# Patient Record
Sex: Male | Born: 1995
Health system: Southern US, Community
[De-identification: ages and names within clinical notes are randomized; demographics above are authoritative.]

## PROBLEM LIST (undated history)

## (undated) ENCOUNTER — Emergency Department (HOSPITAL_COMMUNITY): Discharge status: Against Medical Advice | Payer: 59 | Attending: Emergency Medicine | Admitting: Emergency Medicine

## (undated) DIAGNOSIS — S62101A Fracture of unspecified carpal bone, right wrist, initial encounter for closed fracture: Secondary | ICD-10-CM

## (undated) DIAGNOSIS — K589 Irritable bowel syndrome without diarrhea: Secondary | ICD-10-CM

## (undated) DIAGNOSIS — S329XXA Fracture of unspecified parts of lumbosacral spine and pelvis, initial encounter for closed fracture: Secondary | ICD-10-CM

## (undated) DIAGNOSIS — T148XXA Other injury of unspecified body region, initial encounter: Secondary | ICD-10-CM

## (undated) HISTORY — PX: APPENDECTOMY: SHX54

## (undated) HISTORY — DX: Fracture of unspecified parts of lumbosacral spine and pelvis, initial encounter for closed fracture: S32.9XXA

## (undated) HISTORY — DX: Fracture of unspecified carpal bone, right wrist, initial encounter for closed fracture: S62.101A

## (undated) HISTORY — PX: FINGER SURGERY: SHX640

---
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
## 2016-01-18 ENCOUNTER — Encounter: Payer: Self-pay | Admitting: Internal Medicine

## 2016-01-18 ENCOUNTER — Ambulatory Visit (INDEPENDENT_AMBULATORY_CARE_PROVIDER_SITE_OTHER): Payer: 59 | Admitting: Internal Medicine

## 2016-01-18 VITALS — BP 110/70 | HR 83 | Temp 98.1°F | Resp 18 | Ht 70.5 in | Wt 159.2 lb

## 2016-01-18 DIAGNOSIS — Z91048 Other nonmedicinal substance allergy status: Secondary | ICD-10-CM

## 2016-01-18 DIAGNOSIS — Z9109 Other allergy status, other than to drugs and biological substances: Secondary | ICD-10-CM

## 2016-01-18 DIAGNOSIS — Z0000 Encounter for general adult medical examination without abnormal findings: Secondary | ICD-10-CM | POA: Diagnosis not present

## 2016-01-18 DIAGNOSIS — R351 Nocturia: Secondary | ICD-10-CM
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
## 2016-01-18 NOTE — Progress Notes (Signed)
Pre-visit discussion using our clinic review tool. No additional management support is needed unless otherwise documented below in the visit note.  

## 2016-01-25 ENCOUNTER — Encounter: Payer: Self-pay | Admitting: Internal Medicine

## 2016-01-25 DIAGNOSIS — Z9109 Other allergy status, other than to drugs and biological substances: Secondary | ICD-10-CM | POA: Insufficient documentation

## 2016-01-25 DIAGNOSIS — R351 Nocturia: Secondary | ICD-10-CM | POA: Insufficient documentation

## 2016-01-25 DIAGNOSIS — Z0000 Encounter for general adult medical examination without abnormal findings: Secondary | ICD-10-CM | POA: Insufficient documentation

## 2016-01-25 NOTE — Assessment & Plan Note (Signed)
On zyrtec.

## 2016-01-25 NOTE — Assessment & Plan Note (Signed)
Drinks a lot of water.  Drinks later in the day.  Will shift fluid intake.  Notify me if persistent problems.

## 2016-01-25 NOTE — Assessment & Plan Note (Signed)
Up to date with immunizations.  Obtain records from [facility].

## 2017-01-19 ENCOUNTER — Encounter: Payer: 59 | Admitting: Internal Medicine

## 2017-02-06 ENCOUNTER — Encounter: Payer: 59 | Admitting: Internal Medicine

## 2019-05-20 ENCOUNTER — Other Ambulatory Visit: Payer: Self-pay

## 2019-05-21 ENCOUNTER — Other Ambulatory Visit: Payer: Self-pay

## 2019-05-21 ENCOUNTER — Ambulatory Visit (INDEPENDENT_AMBULATORY_CARE_PROVIDER_SITE_OTHER): Payer: 59 | Admitting: Internal Medicine

## 2019-05-21 VITALS — BP 120/78 | HR 73 | Temp 98.0°F | Resp 16 | Ht 70.0 in | Wt 156.0 lb

## 2019-05-21 DIAGNOSIS — Z9109 Other allergy status, other than to drugs and biological substances: Secondary | ICD-10-CM | POA: Diagnosis not present

## 2019-05-21 DIAGNOSIS — Z0000 Encounter for general adult medical examination without abnormal findings: Secondary | ICD-10-CM
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
## 2019-05-22 ENCOUNTER — Ambulatory Visit: Payer: 59 | Admitting: Internal Medicine

## 2019-05-26 ENCOUNTER — Encounter: Payer: Self-pay | Admitting: Internal Medicine

## 2019-05-26 NOTE — Assessment & Plan Note (Signed)
No allergy problem reported.

## 2019-05-26 NOTE — Assessment & Plan Note (Signed)
Physical today.   

## 2019-05-31 ENCOUNTER — Ambulatory Visit: Payer: 59 | Admitting: Internal Medicine

## 2019-07-17 DIAGNOSIS — U071 COVID-19: Secondary | ICD-10-CM | POA: Diagnosis not present

## 2019-07-17 DIAGNOSIS — Z20828 Contact with and (suspected) exposure to other viral communicable diseases: Secondary | ICD-10-CM | POA: Diagnosis not present

## 2019-07-30 ENCOUNTER — Ambulatory Visit: Payer: 59 | Admitting: Internal Medicine

## 2019-08-16 DIAGNOSIS — H5203 Hypermetropia, bilateral: Secondary | ICD-10-CM | POA: Diagnosis not present

## 2020-05-21 ENCOUNTER — Encounter: Payer: 59 | Admitting: Internal Medicine

## 2020-05-21 DIAGNOSIS — Z0289 Encounter for other administrative examinations: Secondary | ICD-10-CM

## 2020-09-22 ENCOUNTER — Ambulatory Visit
Admission: EM | Admit: 2020-09-22 | Discharge: 2020-09-22 | Disposition: A | Discharge status: Home / Self Care | Payer: 59 | Attending: Family Medicine | Admitting: Family Medicine

## 2020-09-22 ENCOUNTER — Encounter: Payer: Self-pay | Admitting: Emergency Medicine

## 2020-09-22 ENCOUNTER — Other Ambulatory Visit: Payer: Self-pay

## 2020-09-22 DIAGNOSIS — J029 Acute pharyngitis, unspecified: Secondary | ICD-10-CM | POA: Diagnosis not present

## 2020-09-22 LAB — POCT RAPID STREP A (OFFICE): Rapid Strep A Screen: NEGATIVE
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
## 2020-09-22 NOTE — ED Triage Notes (Addendum)
Patient c/o sore throat since this Saturday.    Patient endorses painful swallowing.   Patient denies fever, cough, or any other cold like symptoms.   Patient has used OTC cough/cold and Ibuprofen medication w/ no relief of symptoms.

## 2020-09-22 NOTE — Discharge Instructions (Addendum)
Rapid strep test negative.  Sending for culture.  You  can do warm salt water gargles of throat.  Over-the-counter medicines for symptoms as needed Follow up as needed for continued or worsening symptoms

## 2020-09-25 LAB — CULTURE, GROUP A STREP (THRC)

## 2021-03-04 ENCOUNTER — Other Ambulatory Visit: Payer: Self-pay

## 2021-03-04 ENCOUNTER — Ambulatory Visit
Admission: EM | Admit: 2021-03-04 | Discharge: 2021-03-04 | Disposition: A | Discharge status: Home / Self Care | Payer: 59 | Attending: Emergency Medicine | Admitting: Emergency Medicine

## 2021-03-04 DIAGNOSIS — Z1152 Encounter for screening for COVID-19: Secondary | ICD-10-CM | POA: Diagnosis not present

## 2021-03-04 DIAGNOSIS — J069 Acute upper respiratory infection, unspecified: Secondary | ICD-10-CM

## 2021-03-04 NOTE — Discharge Instructions (Signed)
Take ibuprofen and Mucinex as directed.    Your COVID test is pending.  You should self quarantine until the test result is back.    Follow up with your primary care provider if your symptoms are not improving.     

## 2021-03-04 NOTE — ED Triage Notes (Signed)
Pt reports having nasal congestion, headache and sinus pressure since Sunday. Denies having fever.
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
## 2021-03-05 LAB — SARS-COV-2, NAA 2 DAY TAT

## 2021-03-05 LAB — NOVEL CORONAVIRUS, NAA: SARS-CoV-2, NAA: NOT DETECTED

## 2021-03-24 ENCOUNTER — Other Ambulatory Visit (HOSPITAL_COMMUNITY): Payer: Self-pay

## 2021-03-24 DIAGNOSIS — K5900 Constipation, unspecified: Secondary | ICD-10-CM | POA: Diagnosis not present

## 2021-03-24 DIAGNOSIS — R1013 Epigastric pain: Secondary | ICD-10-CM | POA: Diagnosis not present

## 2021-03-24 MED ORDER — PANTOPRAZOLE SODIUM 40 MG PO TBEC
DELAYED_RELEASE_TABLET | ORAL | 2 refills | Status: DC
  Filled 2021-03-24: qty 30, 30d supply, fill #0

## 2021-10-10 ENCOUNTER — Emergency Department (HOSPITAL_COMMUNITY): Payer: 59

## 2021-10-10 ENCOUNTER — Encounter (HOSPITAL_COMMUNITY): Payer: Self-pay | Admitting: Emergency Medicine

## 2021-10-10 ENCOUNTER — Emergency Department (HOSPITAL_COMMUNITY)
Admission: EM | Admit: 2021-10-10 | Discharge: 2021-10-10 | Disposition: A | Discharge status: Home / Self Care | Payer: 59 | Attending: Emergency Medicine | Admitting: Emergency Medicine

## 2021-10-10 ENCOUNTER — Other Ambulatory Visit: Payer: Self-pay

## 2021-10-10 DIAGNOSIS — S42115A Nondisplaced fracture of body of scapula, left shoulder, initial encounter for closed fracture: Secondary | ICD-10-CM | POA: Diagnosis not present

## 2021-10-10 DIAGNOSIS — S199XXA Unspecified injury of neck, initial encounter: Secondary | ICD-10-CM | POA: Diagnosis not present

## 2021-10-10 DIAGNOSIS — R55 Syncope and collapse: Secondary | ICD-10-CM | POA: Diagnosis not present

## 2021-10-10 DIAGNOSIS — Z79899 Other long term (current) drug therapy: Secondary | ICD-10-CM | POA: Diagnosis not present

## 2021-10-10 DIAGNOSIS — J329 Chronic sinusitis, unspecified: Secondary | ICD-10-CM | POA: Diagnosis not present

## 2021-10-10 DIAGNOSIS — S2232XA Fracture of one rib, left side, initial encounter for closed fracture: Secondary | ICD-10-CM | POA: Insufficient documentation

## 2021-10-10 DIAGNOSIS — S3993XA Unspecified injury of pelvis, initial encounter: Secondary | ICD-10-CM | POA: Diagnosis not present

## 2021-10-10 DIAGNOSIS — S42035A Nondisplaced fracture of lateral end of left clavicle, initial encounter for closed fracture: Secondary | ICD-10-CM | POA: Diagnosis not present

## 2021-10-10 DIAGNOSIS — R911 Solitary pulmonary nodule: Secondary | ICD-10-CM | POA: Diagnosis not present

## 2021-10-10 DIAGNOSIS — Z9049 Acquired absence of other specified parts of digestive tract: Secondary | ICD-10-CM | POA: Diagnosis not present

## 2021-10-10 DIAGNOSIS — S2242XA Multiple fractures of ribs, left side, initial encounter for closed fracture: Secondary | ICD-10-CM | POA: Diagnosis not present

## 2021-10-10 DIAGNOSIS — S3991XA Unspecified injury of abdomen, initial encounter: Secondary | ICD-10-CM | POA: Diagnosis not present

## 2021-10-10 DIAGNOSIS — S40912A Unspecified superficial injury of left shoulder, initial encounter: Secondary | ICD-10-CM | POA: Diagnosis present

## 2021-10-10 DIAGNOSIS — S0993XA Unspecified injury of face, initial encounter: Secondary | ICD-10-CM | POA: Diagnosis not present

## 2021-10-10 LAB — BASIC METABOLIC PANEL
Anion gap: 7 (ref 5–15)
BUN: 21 mg/dL — ABNORMAL HIGH (ref 6–20)
CO2: 29 mmol/L (ref 22–32)
Calcium: 9.2 mg/dL (ref 8.9–10.3)
Chloride: 100 mmol/L (ref 98–111)
Creatinine, Ser: 1.03 mg/dL (ref 0.61–1.24)
GFR, Estimated: >60 mL/min (ref >60)
Glucose, Bld: 101 mg/dL — ABNORMAL HIGH (ref 70–99)
Potassium: 3.9 mmol/L (ref 3.5–5.1)
Sodium: 136 mmol/L (ref 135–145)

## 2021-10-10 LAB — CBC
HCT: 43.3 % (ref 39.0–52.0)
Hemoglobin: 14.6 g/dL (ref 13.0–17.0)
MCH: 28.4 pg (ref 26.0–34.0)
MCHC: 33.7 g/dL (ref 30.0–36.0)
MCV: 84.2 fL (ref 80.0–100.0)
Platelets: 287 10*3/uL (ref 150–400)
RBC: 5.14 MIL/uL (ref 4.22–5.81)
RDW: 12.4 % (ref 11.5–15.5)
WBC: 24.7 10*3/uL — ABNORMAL HIGH (ref 4.0–10.5)
nRBC: 0 % (ref 0.0–0.2)

## 2021-10-10 LAB — CBG MONITORING, ED: Glucose-Capillary: 97 mg/dL (ref 70–99)

## 2021-10-10 MED ORDER — FENTANYL CITRATE PF 50 MCG/ML IJ SOSY
50.0000 ug | PREFILLED_SYRINGE | Freq: Once | INTRAMUSCULAR | Status: AC
  Administered 2021-10-10: 50 ug via INTRAVENOUS
  Filled 2021-10-10: qty 1

## 2021-10-10 MED ORDER — ONDANSETRON HCL 4 MG/2ML IJ SOLN
4.0000 mg | Freq: Once | INTRAMUSCULAR | Status: AC
  Administered 2021-10-10: 4 mg via INTRAVENOUS
  Filled 2021-10-10: qty 2

## 2021-10-10 MED ORDER — OXYCODONE-ACETAMINOPHEN 5-325 MG PO TABS
1.0000 | ORAL_TABLET | Freq: Four times a day (QID) | ORAL | 0 refills | Status: DC | PRN
  Filled 2021-10-10: qty 15, 4d supply, fill #0

## 2021-10-10 MED ORDER — IOHEXOL 350 MG/ML SOLN
80.0000 mL | Freq: Once | INTRAVENOUS | Status: AC | PRN
  Administered 2021-10-10: 80 mL via INTRAVENOUS

## 2021-10-10 NOTE — Progress Notes (Signed)
Orthopedic Tech Progress Note Patient Details:  Ivan Hood 09-06-1996 [id number]  Ortho Devices Type of Ortho Device: Sling immobilizer Ortho Device/Splint Interventions: Application   Post Interventions Patient Tolerated: Well Instructions Provided: Care of device  Saul Fordyce 10/10/2021, 7:54 PM
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
## 2021-10-10 NOTE — ED Notes (Signed)
While examining patients arm patient stated pain was making him feel nauseous and he was going to pass out. Patient laid back by PA and RN and lost consciousness for a few seconds. Patient became pale and diaphoretic. Patient now alert and talking. Mother at bedside.

## 2021-10-10 NOTE — Discharge Instructions (Signed)
Your workup in the ER revealed that you have fractured your left clavicle, left scapula, and your left 6th rib. Fortunately, none of these are displaced and therefore do not require surgery today. I have given you a sling to wear and would like for you to wear it at all times until you can see orthopedics. I have given you a referral with a number to call to schedule an appointment with them.   In the interim, you may ice your injured areas and manage pain with medications I have prescribed for you.  However, please be aware that these are narcotic pain medications and therefore it is important that you do not drive or operate heavy machinery after taking these medications.   Return if development of any new or worsening symptoms.

## 2021-10-10 NOTE — ED Notes (Signed)
Called Lab and had CBC and BMP added onto blood tubes already sent down.

## 2021-10-10 NOTE — ED Notes (Signed)
Ortho Tech called for application of Shoulder Sling

## 2021-10-11 ENCOUNTER — Other Ambulatory Visit (HOSPITAL_COMMUNITY): Payer: Self-pay

## 2021-10-12 ENCOUNTER — Ambulatory Visit (INDEPENDENT_AMBULATORY_CARE_PROVIDER_SITE_OTHER): Payer: 59 | Admitting: Surgical

## 2021-10-12 ENCOUNTER — Other Ambulatory Visit: Payer: Self-pay

## 2021-10-12 ENCOUNTER — Encounter: Payer: Self-pay | Admitting: Surgical

## 2021-10-12 DIAGNOSIS — S42115A Nondisplaced fracture of body of scapula, left shoulder, initial encounter for closed fracture: Secondary | ICD-10-CM

## 2021-10-12 DIAGNOSIS — S42025A Nondisplaced fracture of shaft of left clavicle, initial encounter for closed fracture: Secondary | ICD-10-CM

## 2021-10-12 MED ORDER — CELECOXIB 100 MG PO CAPS: 100.0000 mg | ORAL_CAPSULE | Freq: Two times a day (BID) | ORAL | 0 refills | Status: DC

## 2021-10-12 MED ORDER — ONDANSETRON HCL 4 MG PO TABS: 4.0000 mg | ORAL_TABLET | Freq: Three times a day (TID) | ORAL | 0 refills | Status: DC | PRN

## 2021-10-12 MED ORDER — METHOCARBAMOL 500 MG PO TABS: 500.0000 mg | ORAL_TABLET | Freq: Three times a day (TID) | ORAL | 0 refills | Status: DC | PRN
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
## 2021-10-13 ENCOUNTER — Telehealth: Payer: Self-pay | Admitting: Surgical

## 2021-10-13 ENCOUNTER — Other Ambulatory Visit: Payer: Self-pay | Admitting: Surgical

## 2021-10-13 LAB — VITAMIN D 25 HYDROXY (VIT D DEFICIENCY, FRACTURES): Vit D, 25-Hydroxy: 45 ng/mL (ref 30–100)

## 2021-10-13 LAB — EXTRA LAV TOP TUBE

## 2021-10-13 MED ORDER — OXYCODONE-ACETAMINOPHEN 5-325 MG PO TABS: 1.0000 | ORAL_TABLET | Freq: Four times a day (QID) | ORAL | 0 refills | Status: DC | PRN

## 2021-10-13 MED ORDER — CELECOXIB 100 MG PO CAPS: 100.0000 mg | ORAL_CAPSULE | Freq: Two times a day (BID) | ORAL | 0 refills | Status: DC

## 2021-10-13 MED ORDER — METHOCARBAMOL 500 MG PO TABS: 500.0000 mg | ORAL_TABLET | Freq: Three times a day (TID) | ORAL | 0 refills | Status: DC | PRN

## 2021-10-13 MED ORDER — ONDANSETRON HCL 4 MG PO TABS: 4.0000 mg | ORAL_TABLET | Freq: Three times a day (TID) | ORAL | 0 refills | Status: DC | PRN

## 2021-10-13 NOTE — Telephone Encounter (Signed)
Called and refilled oxy. He will call with any problems. F/u 10/22/21

## 2021-10-13 NOTE — Telephone Encounter (Signed)
Patient would like some pain medication called in for him. His call back number is [phone]. Ivan Hood did not say what medication just that he would send a RX in.

## 2021-10-15 ENCOUNTER — Telehealth: Payer: Self-pay | Admitting: Surgical

## 2021-10-15 ENCOUNTER — Other Ambulatory Visit: Payer: Self-pay | Admitting: Surgical

## 2021-10-15 MED ORDER — HYDROCODONE-ACETAMINOPHEN 5-325 MG PO TABS: 1.0000 | ORAL_TABLET | Freq: Four times a day (QID) | ORAL | 0 refills | Status: AC | PRN

## 2021-10-15 MED ORDER — OXYCODONE-ACETAMINOPHEN 5-325 MG PO TABS: 1.0000 | ORAL_TABLET | Freq: Four times a day (QID) | ORAL | 0 refills | Status: DC | PRN

## 2021-10-15 NOTE — Telephone Encounter (Signed)
IC advised patient. Verbalized understanding.

## 2021-10-15 NOTE — Telephone Encounter (Signed)
Pt called and was wondering if he could get a refill on his pain medication. Pt states the oxycodone has been making him not feel too well so if there is something else he could get that would be great.    Cb [phone]

## 2021-10-15 NOTE — Telephone Encounter (Signed)
I sent in a refill for oxycodone but I will send in prescription for hydrocodone instead to see if this will help with less nausea.  If this does not help, he can let us know and we can try something else

## 2021-10-22 ENCOUNTER — Ambulatory Visit (INDEPENDENT_AMBULATORY_CARE_PROVIDER_SITE_OTHER): Payer: 59

## 2021-10-22 ENCOUNTER — Ambulatory Visit (INDEPENDENT_AMBULATORY_CARE_PROVIDER_SITE_OTHER): Payer: 59 | Admitting: Surgical

## 2021-10-22 ENCOUNTER — Other Ambulatory Visit: Payer: Self-pay

## 2021-10-22 DIAGNOSIS — R0781 Pleurodynia: Secondary | ICD-10-CM | POA: Diagnosis not present

## 2021-10-22 DIAGNOSIS — S42115A Nondisplaced fracture of body of scapula, left shoulder, initial encounter for closed fracture: Secondary | ICD-10-CM

## 2021-10-22 DIAGNOSIS — S42025A Nondisplaced fracture of shaft of left clavicle, initial encounter for closed fracture: Secondary | ICD-10-CM

## 2021-10-24 ENCOUNTER — Encounter: Payer: Self-pay | Admitting: Surgical
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
## 2021-11-04 ENCOUNTER — Other Ambulatory Visit: Payer: Self-pay

## 2021-11-04 ENCOUNTER — Ambulatory Visit (INDEPENDENT_AMBULATORY_CARE_PROVIDER_SITE_OTHER): Payer: 59

## 2021-11-04 ENCOUNTER — Ambulatory Visit (INDEPENDENT_AMBULATORY_CARE_PROVIDER_SITE_OTHER): Payer: 59 | Admitting: Orthopedic Surgery

## 2021-11-04 DIAGNOSIS — S42025A Nondisplaced fracture of shaft of left clavicle, initial encounter for closed fracture: Secondary | ICD-10-CM

## 2021-11-04 DIAGNOSIS — S42115A Nondisplaced fracture of body of scapula, left shoulder, initial encounter for closed fracture: Secondary | ICD-10-CM

## 2021-11-06 ENCOUNTER — Encounter: Payer: Self-pay | Admitting: Orthopedic Surgery
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
## 2021-11-17 ENCOUNTER — Other Ambulatory Visit: Payer: Self-pay

## 2021-11-17 ENCOUNTER — Ambulatory Visit (INDEPENDENT_AMBULATORY_CARE_PROVIDER_SITE_OTHER): Payer: 59

## 2021-11-17 ENCOUNTER — Ambulatory Visit (INDEPENDENT_AMBULATORY_CARE_PROVIDER_SITE_OTHER): Payer: 59 | Admitting: Surgical

## 2021-11-17 ENCOUNTER — Encounter: Payer: Self-pay | Admitting: Orthopedic Surgery

## 2021-11-17 DIAGNOSIS — S42025A Nondisplaced fracture of shaft of left clavicle, initial encounter for closed fracture: Secondary | ICD-10-CM
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
## 2021-12-08 ENCOUNTER — Ambulatory Visit: Payer: 59 | Admitting: Orthopedic Surgery

## 2021-12-21 ENCOUNTER — Telehealth: Payer: Self-pay | Admitting: Orthopedic Surgery
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
## 2022-01-25 ENCOUNTER — Other Ambulatory Visit: Payer: Self-pay

## 2022-11-14 ENCOUNTER — Ambulatory Visit (INDEPENDENT_AMBULATORY_CARE_PROVIDER_SITE_OTHER): Payer: Self-pay | Admitting: Internal Medicine

## 2022-11-14 ENCOUNTER — Encounter: Payer: Self-pay | Admitting: Internal Medicine

## 2022-11-14 VITALS — BP 128/78 | HR 80 | Temp 97.9°F | Resp 16 | Ht 70.0 in | Wt 177.0 lb

## 2022-11-14 DIAGNOSIS — Z13220 Encounter for screening for lipoid disorders: Secondary | ICD-10-CM

## 2022-11-14 DIAGNOSIS — R5383 Other fatigue: Secondary | ICD-10-CM

## 2022-11-14 DIAGNOSIS — N529 Male erectile dysfunction, unspecified: Secondary | ICD-10-CM
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
## 2022-11-15 ENCOUNTER — Other Ambulatory Visit: Payer: Self-pay

## 2022-11-18 ENCOUNTER — Other Ambulatory Visit (INDEPENDENT_AMBULATORY_CARE_PROVIDER_SITE_OTHER): Payer: Self-pay

## 2022-11-18 DIAGNOSIS — R5383 Other fatigue: Secondary | ICD-10-CM

## 2022-11-18 DIAGNOSIS — Z13220 Encounter for screening for lipoid disorders: Secondary | ICD-10-CM

## 2022-11-18 LAB — LIPID PANEL
Cholesterol: 110 mg/dL (ref 0–200)
HDL: 22.4 mg/dL — ABNORMAL LOW (ref >39.00)
LDL Cholesterol: 67 mg/dL (ref 0–99)
NonHDL: 87.88
Total CHOL/HDL Ratio: 5
Triglycerides: 104 mg/dL (ref 0.0–149.0)
VLDL: 20.8 mg/dL (ref 0.0–40.0)

## 2022-11-18 LAB — COMPREHENSIVE METABOLIC PANEL
ALT: 13 U/L (ref 0–53)
AST: 17 U/L (ref 0–37)
Albumin: 4.2 g/dL (ref 3.5–5.2)
Alkaline Phosphatase: 62 U/L (ref 39–117)
BUN: 15 mg/dL (ref 6–23)
CO2: 29 mEq/L (ref 19–32)
Calcium: 9.1 mg/dL (ref 8.4–10.5)
Chloride: 105 mEq/L (ref 96–112)
Creatinine, Ser: 0.9 mg/dL (ref 0.40–1.50)
GFR: 117.43 mL/min (ref >60.00)
Glucose, Bld: 95 mg/dL (ref 70–99)
Potassium: 4.2 mEq/L (ref 3.5–5.1)
Sodium: 141 mEq/L (ref 135–145)
Total Bilirubin: 0.4 mg/dL (ref 0.2–1.2)
Total Protein: 6.7 g/dL (ref 6.0–8.3)

## 2022-11-18 LAB — CBC WITH DIFFERENTIAL/PLATELET
Basophils Absolute: 0 10*3/uL (ref 0.0–0.1)
Basophils Relative: 0.4 % (ref 0.0–3.0)
Eosinophils Absolute: 0.1 10*3/uL (ref 0.0–0.7)
Eosinophils Relative: 1 % (ref 0.0–5.0)
HCT: 43.4 % (ref 39.0–52.0)
Hemoglobin: 15.1 g/dL (ref 13.0–17.0)
Lymphocytes Relative: 31.9 % (ref 12.0–46.0)
Lymphs Abs: 1.9 10*3/uL (ref 0.7–4.0)
MCHC: 34.9 g/dL (ref 30.0–36.0)
MCV: 83 fl (ref 78.0–100.0)
Monocytes Absolute: 0.8 10*3/uL (ref 0.1–1.0)
Monocytes Relative: 13.6 % — ABNORMAL HIGH (ref 3.0–12.0)
Neutro Abs: 3.2 10*3/uL (ref 1.4–7.7)
Neutrophils Relative %: 53.1 % (ref 43.0–77.0)
Platelets: 202 10*3/uL (ref 150.0–400.0)
RBC: 5.24 Mil/uL (ref 4.22–5.81)
RDW: 12.9 % (ref 11.5–15.5)
WBC: 6 10*3/uL (ref 4.0–10.5)

## 2022-11-18 LAB — VITAMIN D 25 HYDROXY (VIT D DEFICIENCY, FRACTURES): VITD: 34.38 ng/mL (ref 30.00–100.00)

## 2022-11-18 LAB — TSH: TSH: 1.12 u[IU]/mL (ref 0.35–5.50)

## 2022-11-18 LAB — TESTOSTERONE: Testosterone: 415.52 ng/dL (ref 300.00–890.00)

## 2022-11-18 LAB — VITAMIN B12: Vitamin B-12: 495 pg/mL (ref 211–911)

## 2022-11-20 ENCOUNTER — Encounter: Payer: Self-pay | Admitting: Internal Medicine

## 2022-11-20 DIAGNOSIS — N529 Male erectile dysfunction, unspecified: Secondary | ICD-10-CM | POA: Insufficient documentation

## 2022-11-20 NOTE — Assessment & Plan Note (Signed)
Symptoms as outlined.  Check routine labs and testosterone level.  No depression.  Follow.

## 2022-11-20 NOTE — Assessment & Plan Note (Signed)
Does report noticing - over the last 6-8 months - some decreased energy.  Not feeling as rested.  No snoring.  He has tried adjusting his diet and exercising to see if helps.  Given persistence, will check cbc, metabolic panel, thyroid test, B12 and testosterone level.  Further w/up pending results.

## 2022-11-21 ENCOUNTER — Telehealth: Payer: Self-pay

## 2022-11-21 NOTE — Telephone Encounter (Signed)
ATTEMPTED TO CALL PT  NO ANS, NO VM   Ivan Pheasant, MD  [facility] Notify - cholesterol levels are ok.  Good cholesterol is low.  The exercise will help increase good cholesterol.  Remainder of cholesterol levels look good.  Testosterone level is normal.  Kidney function and liver function tests are wnl.  His hemoglobin, thyroid test, B12 level and vitamin D level are all wnl.

## 2023-02-21 ENCOUNTER — Ambulatory Visit: Payer: Self-pay | Admitting: Internal Medicine

## 2023-06-03 LAB — LAB REPORT - SCANNED: EGFR: 105
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
## 2023-09-15 LAB — LAB REPORT - SCANNED: EGFR: 101

## 2023-12-26 ENCOUNTER — Telehealth (INDEPENDENT_AMBULATORY_CARE_PROVIDER_SITE_OTHER): Payer: Self-pay | Admitting: Internal Medicine

## 2023-12-26 ENCOUNTER — Encounter: Payer: Self-pay | Admitting: Internal Medicine

## 2023-12-26 VITALS — Ht 70.0 in | Wt 177.0 lb

## 2023-12-26 DIAGNOSIS — Z3141 Encounter for fertility testing: Secondary | ICD-10-CM | POA: Insufficient documentation

## 2023-12-26 NOTE — Assessment & Plan Note (Signed)
 Desires to have sperm count tested as outlined. Currently being followed [facility]. They are prescribing current medications. Will obtain a copy of labs and have sent to [place]. Referral to fertility clinic.
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
## 2023-12-29 ENCOUNTER — Telehealth: Payer: Self-pay | Admitting: Internal Medicine

## 2023-12-29 DIAGNOSIS — Z3141 Encounter for fertility testing: Secondary | ICD-10-CM

## 2023-12-29 DIAGNOSIS — N529 Male erectile dysfunction, unspecified: Secondary | ICD-10-CM

## 2023-12-29 NOTE — Telephone Encounter (Signed)
 Ok to go ahead and send referral even though we have not received records from [facility]?

## 2023-12-29 NOTE — Telephone Encounter (Signed)
 I was waiting on records to review, given testosterone replacement therapy (which they are prescribing) can lower sperm count.  What I would like to do is refer him to urology (if agreeable to come to [place]), they do sperm count testing. They can also address his other concerns. If agreeable, let me know and I will place order for referral.

## 2023-12-29 NOTE — Telephone Encounter (Signed)
 Copied from CRM [id number]. Topic: Referral - Status >> Dec 28, 2023  1:27 PM Jon Gills C wrote: Reason for CRM: Patient called in stating he had a appointment with Dr.Scott on the the 5th and was suppose to be referred to the [facility], would like to know what the status is on that, and call back if it has been completed and sent

## 2024-01-01 NOTE — Telephone Encounter (Signed)
 Records have been requested. He is agreeable to see urology in [place]. Patient is aware that we are still waiting for records but he would like to move forward with referral.

## 2024-01-02 NOTE — Addendum Note (Signed)
 Addended by: Charm Barges on: 01/02/2024 05:10 AM   Modules accepted: Orders

## 2024-01-02 NOTE — Telephone Encounter (Signed)
 Order placed for urology referral.
